# Patient Record
Sex: Female | Born: 1970 | Race: Black or African American | Hispanic: No | Marital: Single | Smoking: Current every day smoker
Health system: Southern US, Community
[De-identification: ages and names within clinical notes are randomized; demographics above are authoritative.]

## PROBLEM LIST (undated history)

## (undated) DIAGNOSIS — I2510 Atherosclerotic heart disease of native coronary artery without angina pectoris: Secondary | ICD-10-CM

## (undated) DIAGNOSIS — N289 Disorder of kidney and ureter, unspecified: Secondary | ICD-10-CM

## (undated) DIAGNOSIS — I10 Essential (primary) hypertension: Secondary | ICD-10-CM

## (undated) DIAGNOSIS — E119 Type 2 diabetes mellitus without complications: Secondary | ICD-10-CM

## (undated) DIAGNOSIS — G4700 Insomnia, unspecified: Secondary | ICD-10-CM

## (undated) HISTORY — PX: CARDIAC SURGERY: SHX584

---

## 2010-04-23 ENCOUNTER — Encounter: Payer: Self-pay | Admitting: Cardiology

## 2010-05-14 ENCOUNTER — Encounter: Payer: Self-pay | Admitting: Cardiology

## 2010-06-14 ENCOUNTER — Encounter: Payer: Self-pay | Admitting: Cardiology

## 2010-07-14 ENCOUNTER — Encounter: Payer: Self-pay | Admitting: Cardiology

## 2010-08-14 ENCOUNTER — Encounter: Payer: Self-pay | Admitting: Cardiology

## 2010-09-14 ENCOUNTER — Encounter: Payer: Self-pay | Admitting: Cardiology

## 2015-04-21 ENCOUNTER — Encounter: Payer: Self-pay | Admitting: *Deleted

## 2015-04-21 ENCOUNTER — Ambulatory Visit
Admission: EM | Admit: 2015-04-21 | Discharge: 2015-04-21 | Disposition: A | Discharge status: Home / Self Care | Payer: BLUE CROSS/BLUE SHIELD | Attending: Family Medicine | Admitting: Family Medicine

## 2015-04-21 DIAGNOSIS — F419 Anxiety disorder, unspecified: Secondary | ICD-10-CM

## 2015-04-21 DIAGNOSIS — I10 Essential (primary) hypertension: Secondary | ICD-10-CM | POA: Diagnosis not present

## 2015-04-21 HISTORY — DX: Insomnia, unspecified: G47.00

## 2015-04-21 HISTORY — DX: Disorder of kidney and ureter, unspecified: N28.9

## 2015-04-21 HISTORY — DX: Essential (primary) hypertension: I10

## 2015-04-21 HISTORY — DX: Atherosclerotic heart disease of native coronary artery without angina pectoris: I25.10

## 2015-04-21 HISTORY — DX: Type 2 diabetes mellitus without complications: E11.9

## 2015-04-21 MED ORDER — LORAZEPAM 1 MG PO TABS: 1.0000 mg | ORAL_TABLET | Freq: Three times a day (TID) | ORAL | Status: AC | PRN

## 2015-04-21 MED ORDER — CLONIDINE HCL 0.2 MG PO TABS
0.2000 mg | ORAL_TABLET | Freq: Every day | ORAL | Status: DC
  Administered 2015-04-21: 0.2 mg via ORAL

## 2015-04-21 NOTE — ED Notes (Signed)
Patient has been experiencing high blood pressure for a week. Patient does have an extensive cardiac history.

## 2015-04-21 NOTE — ED Provider Notes (Signed)
CSN: [id number]     Arrival date & time 04/21/15  [id number] History   First MD Initiated Contact with Patient 04/21/15 1013     Chief Complaint  Patient presents with  . Hypertension   (Consider location/radiation/quality/duration/timing/severity/associated sxs/prior Treatment) HPI Comments: 45 yo female with a c/o "high blood pressures". Patient has a h/o hypertension and is on medication prescribed by PCP, however states over the past week has noticed her blood pressures have been high at home >160 systolic. 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   The history is provided by the patient.    Past Medical History  Diagnosis Date  . Hypertension   . Diabetes mellitus without complication (HCC)   . Coronary artery disease   . Renal disorder   . Insomnia    Past Surgical History  Procedure Laterality Date  . Cardiac surgery     History reviewed. No pertinent family history. Social History  Substance Use Topics  . Smoking status: Current Every Day Smoker  . Smokeless tobacco: Never Used  . Alcohol Use: Yes   OB History    No data available     Review of Systems  Allergies  Review of patient's allergies indicates no known allergies.  Home Medications   Prior to Admission medications   Medication Sig Start Date End Date Taking? Authorizing Provider  aspirin 81 MG tablet Take 81 mg by mouth daily.   Yes Historical Provider, MD  hydrochlorothiazide (MICROZIDE) 12.5 MG capsule Take 12.5 mg by mouth daily.   Yes Historical Provider, MD  lisinopril (PRINIVIL,ZESTRIL) 20 MG tablet Take 20 mg by mouth daily.   Yes Historical Provider, MD  metFORMIN (GLUCOPHAGE) 1000 MG tablet Take 1,000 mg by mouth 2 (two) times daily with a meal.   Yes Historical Provider, MD  metoprolol succinate (TOPROL-XL) 25 MG 24 hr tablet Take 25 mg  by mouth daily.   Yes Historical Provider, MD  naproxen (NAPROSYN) 500 MG tablet Take 500 mg by mouth 2 (two) times daily with a meal.   Yes Historical Provider, MD  rosuvastatin (CRESTOR) 5 MG tablet Take 5 mg by mouth daily at 6 PM.   Yes Historical Provider, MD  valACYclovir (VALTREX) 1000 MG tablet Take 1,000 mg by mouth daily.   Yes Historical Provider, MD  LORazepam (ATIVAN) 1 MG tablet Take 1 tablet (1 mg total) by mouth every 8 (eight) hours as needed for anxiety. 04/21/15   Payton Mccallumrlando Bettyjane Shenoy, MD  nitroGLYCERIN (NITROSTAT) 0.4 MG SL tablet Place 0.4 mg under the tongue every 5 (five) minutes as needed for chest pain.    Historical Provider, MD   Meds Ordered and Administered this Visit  Medications - No data to display  BP 126/90 mmHg  Pulse 61  Temp(Src) 98.3 F (36.8 C) (Oral)  Resp 18  Ht 5\' 5"  (1.651 m)  Wt 197 lb (89.359 kg)  BMI 32.78 kg/m2  SpO2 100%  LMP 03/05/2015 No data found.   Physical Exam  Constitutional: She is oriented to person, place, and time. She appears well-developed and well-nourished. No distress.  HENT:  Head: Normocephalic and atraumatic.  Neck: Normal range of motion. Neck supple.  Cardiovascular: Normal rate, regular rhythm and normal heart sounds.   Pulmonary/Chest: Effort normal and breath sounds normal. No stridor. No respiratory distress. She has no wheezes. She has no rales.  Musculoskeletal: She exhibits  no edema.  Neurological: She is alert and oriented to person, place, and time. No cranial nerve deficit.  Skin: She is not diaphoretic.  Nursing note and vitals reviewed.   ED Course  Procedures (including critical care time)  Labs Review Labs Reviewed - No data to display  Imaging Review No results found.   Visual Acuity Review  Right Eye Distance:   Left Eye Distance:   Bilateral Distance:    Right Eye Near:   Left Eye Near:    Bilateral Near:         MDM   1. Essential hypertension   2. Anxiety    Discharge  Medication List as of 04/21/2015 12:08 PM    START taking these medications   Details  LORazepam (ATIVAN) 1 MG tablet Take 1 tablet (1 mg total) by mouth every 8 (eight) hours as needed for anxiety., Starting 04/21/2015, Until Discontinued, Print       1.  diagnosis reviewed with patient; patient given clonidine 0.2mg  po x 1 with improvement of blood pressure 2. rx as per orders above; reviewed possible side effects, interactions, risks and benefits  3. Recommend supportive treatment with anxiety/stress management 4. Follow-up with PCP 5. F/U here prn if symptoms worsen or don't improve    Payton Mccallum, MD 04/21/15 1525

## 2017-10-06 ENCOUNTER — Emergency Department: Payer: BLUE CROSS/BLUE SHIELD

## 2017-10-06 ENCOUNTER — Emergency Department
Admission: EM | Admit: 2017-10-06 | Discharge: 2017-10-06 | Disposition: A | Discharge status: Home / Self Care | Payer: BLUE CROSS/BLUE SHIELD | Attending: Emergency Medicine | Admitting: Emergency Medicine

## 2017-10-06 ENCOUNTER — Other Ambulatory Visit: Payer: Self-pay

## 2017-10-06 DIAGNOSIS — E119 Type 2 diabetes mellitus without complications: Secondary | ICD-10-CM | POA: Insufficient documentation

## 2017-10-06 DIAGNOSIS — Z794 Long term (current) use of insulin: Secondary | ICD-10-CM | POA: Insufficient documentation

## 2017-10-06 DIAGNOSIS — I10 Essential (primary) hypertension: Secondary | ICD-10-CM | POA: Diagnosis not present

## 2017-10-06 DIAGNOSIS — R202 Paresthesia of skin: Secondary | ICD-10-CM

## 2017-10-06 DIAGNOSIS — Z79899 Other long term (current) drug therapy: Secondary | ICD-10-CM | POA: Insufficient documentation

## 2017-10-06 DIAGNOSIS — I259 Chronic ischemic heart disease, unspecified: Secondary | ICD-10-CM | POA: Insufficient documentation

## 2017-10-06 DIAGNOSIS — Z7982 Long term (current) use of aspirin: Secondary | ICD-10-CM | POA: Insufficient documentation

## 2017-10-06 DIAGNOSIS — F17200 Nicotine dependence, unspecified, uncomplicated: Secondary | ICD-10-CM | POA: Diagnosis not present

## 2017-10-06 DIAGNOSIS — R531 Weakness: Secondary | ICD-10-CM | POA: Diagnosis present

## 2017-10-06 LAB — BASIC METABOLIC PANEL
ANION GAP: 12 (ref 5–15)
BUN: 11 mg/dL (ref 6–20)
CHLORIDE: 100 mmol/L (ref 98–111)
CO2: 23 mmol/L (ref 22–32)
Calcium: 9.6 mg/dL (ref 8.9–10.3)
Creatinine, Ser: 0.95 mg/dL (ref 0.44–1.00)
GFR calc Af Amer: >60 mL/min (ref >60)
GFR calc non Af Amer: >60 mL/min (ref >60)
GLUCOSE: 182 mg/dL — AB (ref 70–99)
POTASSIUM: 3.8 mmol/L (ref 3.5–5.1)
Sodium: 135 mmol/L (ref 135–145)

## 2017-10-06 LAB — CBC WITH DIFFERENTIAL/PLATELET
BASOS PCT: 1 %
Basophils Absolute: 0.1 10*3/uL (ref 0–0.1)
Eosinophils Absolute: 0.1 10*3/uL (ref 0–0.7)
Eosinophils Relative: 1 %
HEMATOCRIT: 41.3 % (ref 35.0–47.0)
Hemoglobin: 14.1 g/dL (ref 12.0–16.0)
LYMPHS ABS: 1.6 10*3/uL (ref 1.0–3.6)
LYMPHS PCT: 29 %
MCH: 32 pg (ref 26.0–34.0)
MCHC: 34.2 g/dL (ref 32.0–36.0)
MCV: 93.5 fL (ref 80.0–100.0)
MONOS PCT: 7 %
Monocytes Absolute: 0.4 10*3/uL (ref 0.2–0.9)
NEUTROS PCT: 62 %
Neutro Abs: 3.4 10*3/uL (ref 1.4–6.5)
Platelets: 254 10*3/uL (ref 150–440)
RBC: 4.42 MIL/uL (ref 3.80–5.20)
RDW: 14.7 % — ABNORMAL HIGH (ref 11.5–14.5)
WBC: 5.5 10*3/uL (ref 3.6–11.0)

## 2017-10-06 LAB — GLUCOSE, CAPILLARY: GLUCOSE-CAPILLARY: 181 mg/dL — AB (ref 70–99)

## 2017-10-06 LAB — URINALYSIS, COMPLETE (UACMP) WITH MICROSCOPIC
BACTERIA UA: NONE SEEN
BILIRUBIN URINE: NEGATIVE
Glucose, UA: NEGATIVE mg/dL
Hgb urine dipstick: NEGATIVE
KETONES UR: NEGATIVE mg/dL
Leukocytes, UA: NEGATIVE
NITRITE: NEGATIVE
Protein, ur: NEGATIVE mg/dL
SPECIFIC GRAVITY, URINE: 1.002 — AB (ref 1.005–1.030)
pH: 6 (ref 5.0–8.0)

## 2017-10-06 LAB — TROPONIN I
Troponin I: <0.03 ng/mL (ref <0.03)
Troponin I: <0.03 ng/mL (ref <0.03)

## 2017-10-06 LAB — POCT PREGNANCY, URINE: PREG TEST UR: NEGATIVE

## 2017-10-06 NOTE — Discharge Instructions (Addendum)
Your labs, CT scan, and MRI today all were unremarkable. Results are copied below. Please follow up with your primary care doctor for further evaluation of your symptoms.  Results for orders placed or performed during the hospital encounter of 10/06/17  Basic metabolic panel  Result Value Ref Range   Sodium 135 135 - 145 mmol/L   Potassium 3.8 3.5 - 5.1 mmol/L   Chloride 100 98 - 111 mmol/L   CO2 23 22 - 32 mmol/L   Glucose, Bld 182 (H) 70 - 99 mg/dL   BUN 11 6 - 20 mg/dL   Creatinine, Ser 1.610.95 0.44 - 1.00 mg/dL   Calcium 9.6 8.9 - 09.610.3 mg/dL   GFR calc non Af Amer >60 >60 mL/min   GFR calc Af Amer >60 >60 mL/min   Anion gap 12 5 - 15  Troponin I  Result Value Ref Range   Troponin I <0.03 <0.03 ng/mL  CBC with Differential  Result Value Ref Range   WBC 5.5 3.6 - 11.0 K/uL   RBC 4.42 3.80 - 5.20 MIL/uL   Hemoglobin 14.1 12.0 - 16.0 g/dL   HCT 04.541.3 40.935.0 - 81.147.0 %   MCV 93.5 80.0 - 100.0 fL   MCH 32.0 26.0 - 34.0 pg   MCHC 34.2 32.0 - 36.0 g/dL   RDW 91.414.7 (H) 78.211.5 - 95.614.5 %   Platelets 254 150 - 440 K/uL   Neutrophils Relative % 62 %   Neutro Abs 3.4 1.4 - 6.5 K/uL   Lymphocytes Relative 29 %   Lymphs Abs 1.6 1.0 - 3.6 K/uL   Monocytes Relative 7 %   Monocytes Absolute 0.4 0.2 - 0.9 K/uL   Eosinophils Relative 1 %   Eosinophils Absolute 0.1 0 - 0.7 K/uL   Basophils Relative 1 %   Basophils Absolute 0.1 0 - 0.1 K/uL  Urinalysis, Complete w Microscopic  Result Value Ref Range   Color, Urine COLORLESS (A) YELLOW   APPearance CLEAR (A) CLEAR   Specific Gravity, Urine 1.002 (L) 1.005 - 1.030   pH 6.0 5.0 - 8.0   Glucose, UA NEGATIVE NEGATIVE mg/dL   Hgb urine dipstick NEGATIVE NEGATIVE   Bilirubin Urine NEGATIVE NEGATIVE   Ketones, ur NEGATIVE NEGATIVE mg/dL   Protein, ur NEGATIVE NEGATIVE mg/dL   Nitrite NEGATIVE NEGATIVE   Leukocytes, UA NEGATIVE NEGATIVE   RBC / HPF 0-5 0 - 5 RBC/hpf   WBC, UA 0-5 0 - 5 WBC/hpf   Bacteria, UA NONE SEEN NONE SEEN   Squamous Epithelial  / LPF 0-5 0 - 5  Glucose, capillary  Result Value Ref Range   Glucose-Capillary 181 (H) 70 - 99 mg/dL  Troponin I  Result Value Ref Range   Troponin I <0.03 <0.03 ng/mL  Pregnancy, urine POC  Result Value Ref Range   Preg Test, Ur NEGATIVE NEGATIVE   Ct Head Wo Contrast  Result Date: 10/06/2017 CLINICAL DATA:  Left-sided tingling and weakness. Intermittent headaches EXAM: CT HEAD WITHOUT CONTRAST TECHNIQUE: Contiguous axial images were obtained from the base of the skull through the vertex without intravenous contrast. COMPARISON:  None. FINDINGS: Brain: Ventricles overall are mildly prominent diffusely for age. Sulci appear normal. No intracranial mass, hemorrhage, extra-axial fluid collection, or midline shift. No focal gray-white compartment lesions are evident. No acute infarct. Vascular: No hyperdense vessel. There is no appreciable vascular calcification. Skull: Bony calvarium appears intact. Sinuses/Orbits: Visualized paranasal sinuses are clear. Visualized orbits appear symmetric bilaterally. Other: Mastoid air cells are clear. IMPRESSION: Ventricles  prominent for age in a generalized manner. Sulci appear normal. No evident mass or hemorrhage. Gray-white compartments appear normal. Electronically Signed   By: Bretta Bang III M.D.   On: 10/06/2017 14:11   Mr Brain Wo Contrast  Result Date: 10/06/2017 CLINICAL DATA:  LEFT extremity numbness beginning at 11 a.m. History of hypertension and diabetes. EXAM: MRI HEAD WITHOUT CONTRAST TECHNIQUE: Multiplanar, multiecho pulse sequences of the brain and surrounding structures were obtained without intravenous contrast. COMPARISON:  CT HEAD October 06, 2017 FINDINGS: INTRACRANIAL CONTENTS: No reduced diffusion to suggest acute ischemia or hyperacute demyelination. No susceptibility artifact to suggest hemorrhage. Moderate global parenchymal brain volume loss. Minimal periventricular nonspecific white matter FLAIR T2 hyperintensities. No  hydrocephalus. No suspicious parenchymal signal, masses, mass effect. No abnormal extra-axial fluid collections. No extra-axial masses. VASCULAR: Normal major intracranial vascular flow voids present at skull base. SKULL AND UPPER CERVICAL SPINE: No abnormal sellar expansion. No suspicious calvarial bone marrow signal. Craniocervical junction maintained. SINUSES/ORBITS: The mastoid air-cells and included paranasal sinuses are well-aerated.The included ocular globes and orbital contents are non-suspicious. OTHER: None. IMPRESSION: 1. No acute intracranial process. 2. Moderate global parenchymal brain volume loss. Electronically Signed   By: Awilda Metro M.D.   On: 10/06/2017 17:32

## 2017-10-06 NOTE — ED Triage Notes (Signed)
Per EMS pt was at work and noticed about 11am that she was experiencing left arm, hand, and leg numbness.  Pt denies symptoms on arrival to ED.  VS for EMS:  197/106, 90 pulse.  Blood glucose 135.  Hx of HTN;  Takes Metoprolol and Lisinopril daily.

## 2017-10-06 NOTE — ED Provider Notes (Signed)
[facility] Emergency Department Provider Note  ____________________________________________  Time seen: Approximately 1:26 PM  I have reviewed the triage vital signs and the nursing notes.   HISTORY  Chief Complaint Weakness    HPI Julie Petersen is a 47 y.o. female who was in her usual state of health until about 10:30 AM today when she had paresthesia of the left forearm left hand and left lower leg.  Denies any motor weakness vision changes or headaches.  No dizziness or syncope.  No recent trauma.  No chest pain or shortness of breath or exertional symptoms.  Discontinued for an hour and a half and then resolved.  She is currently back to normal and asymptomatic but she has a strong family history of strokes.  She also has a history of diabetes hypertension and smoking, so she comes to the ED for evaluation.   The patient took 325 mg of aspirin prior to EMS arrival on scene today.   Past Medical History:  Diagnosis Date  . Coronary artery disease   . Diabetes mellitus without complication (HCC)   . Hypertension   . Insomnia   . Renal disorder      There are no active problems to display for this patient.    Past Surgical History:  Procedure Laterality Date  . CARDIAC SURGERY    Coronary artery stent   Prior to Admission medications   Medication Sig Start Date End Date Taking? Authorizing Provider  amLODipine (NORVASC) 5 MG tablet Take 1 tablet by mouth daily. 10/05/17  Yes [provider]  aspirin 81 MG tablet Take 81 mg by mouth daily.   Yes [provider]  lisinopril (PRINIVIL,ZESTRIL) 20 MG tablet Take 20 mg by mouth daily.   Yes [provider]  metFORMIN (GLUCOPHAGE) 1000 MG tablet Take 1,000 mg by mouth 2 (two) times daily with a meal.   Yes [provider]  metoprolol succinate (TOPROL-XL) 25 MG 24 hr tablet Take 25 mg by mouth daily.   Yes [provider]  rosuvastatin (CRESTOR) 5 MG  tablet Take 5 mg by mouth daily at 6 PM.   Yes [provider]  Semaglutide,0.25 or 0.5MG /DOS, 2 MG/1.5ML SOPN Inject 0.25 mg into the skin once a week. 10/02/17  Yes [provider]  TRESIBA FLEXTOUCH 100 UNIT/ML SOPN FlexTouch Pen Inject 20 Units into the skin daily. 10/02/17  Yes [provider]  valACYclovir (VALTREX) 1000 MG tablet Take 1,000 mg by mouth daily.   Yes [provider]  LORazepam (ATIVAN) 1 MG tablet Take 1 tablet (1 mg total) by mouth every 8 (eight) hours as needed for anxiety. Patient not taking: Reported on 10/06/2017 04/21/15   Payton Mccallum, MD  naproxen (NAPROSYN) 500 MG tablet Take 500 mg by mouth 2 (two) times daily with a meal.    [provider]  nitroGLYCERIN (NITROSTAT) 0.4 MG SL tablet Place 0.4 mg under the tongue every 5 (five) minutes as needed for chest pain.    [provider]     Allergies Patient has no known allergies.   No family history on file.  Social History Social History   Tobacco Use  . Smoking status: Current Every Day Smoker  . Smokeless tobacco: Never Used  Substance Use Topics  . Alcohol use: Yes  . Drug use: No    Review of Systems  Constitutional:   No fever or chills.  ENT:   No sore throat. No rhinorrhea. Cardiovascular:   No  chest pain or syncope. Respiratory:   No dyspnea or cough. Gastrointestinal:   Negative for abdominal pain, vomiting and diarrhea.  Musculoskeletal:   Negative for focal pain or swelling Neurologic: Left arm and left leg paresthesia as above All other systems reviewed and are negative except as documented above in ROS and HPI.  ____________________________________________   PHYSICAL EXAM:  VITAL SIGNS: ED Triage Vitals  Enc Vitals Group     BP 10/06/17 1324 (!) 154/107     Pulse Rate 10/06/17 1324 82     Resp --      Temp 10/06/17 1324 98.7 F (37.1 C)     Temp Source 10/06/17 1324 Oral     SpO2 10/06/17 1324 100 %     Weight 10/06/17  1325 210 lb (95.3 kg)     Height 10/06/17 1325 5\' 5"  (1.651 m)     Head Circumference --      Peak Flow --      Pain Score 10/06/17 1325 0     Pain Loc --      Pain Edu? --      Excl. in GC? --     Vital signs reviewed, nursing assessments reviewed.   Constitutional:   Alert and oriented. Non-toxic appearance. Eyes:   Conjunctivae are normal. EOMI. PERRL. ENT      Head:   Normocephalic and atraumatic.      Nose:   No congestion/rhinnorhea.       Mouth/Throat:   MMM, no pharyngeal erythema. No peritonsillar mass.       Neck:   No meningismus. Full ROM. Hematological/Lymphatic/Immunilogical:   No cervical lymphadenopathy. Cardiovascular:   RRR. Symmetric bilateral radial and DP pulses.  No murmurs. Cap refill less than 2 seconds. Respiratory:   Normal respiratory effort without tachypnea/retractions. Breath sounds are clear and equal bilaterally. No wheezes/rales/rhonchi. Gastrointestinal:   Soft and nontender. Non distended. There is no CVA tenderness.  No rebound, rigidity, or guarding. Musculoskeletal:   Normal range of motion in all extremities. No joint effusions.  No lower extremity tenderness.  No edema. Neurologic:   Normal speech and language. Cranial nerves III through XII intact No pronator drift.  Normal cerebellar function Motor grossly intact. No acute focal neurologic deficits are appreciated.  Skin:    Skin is warm, dry and intact. No rash noted.  No petechiae, purpura, or bullae.  ____________________________________________    LABS (pertinent positives/negatives) (all labs ordered are listed, but only abnormal results are displayed) Labs Reviewed  BASIC METABOLIC PANEL - Abnormal; Notable for the following components:      Result Value   Glucose, Bld 182 (*)    All other components within normal limits  CBC WITH DIFFERENTIAL/PLATELET - Abnormal; Notable for the following components:   RDW 14.7 (*)    All other components within normal limits  URINALYSIS,  COMPLETE (UACMP) WITH MICROSCOPIC - Abnormal; Notable for the following components:   Color, Urine COLORLESS (*)    APPearance CLEAR (*)    Specific Gravity, Urine 1.002 (*)    All other components within normal limits  GLUCOSE, CAPILLARY - Abnormal; Notable for the following components:   Glucose-Capillary 181 (*)    All other components within normal limits  TROPONIN I  TROPONIN I  POC URINE PREG, ED  POCT PREGNANCY, URINE   ____________________________________________   EKG  Interpreted by me Sinus rhythm rate of 65, normal axis intervals QRS and ST segments.  Inferior T wave inversions, nonacute in appearance and  likely related to chronic CAD.  No prior comparison available..  ____________________________________________    RADIOLOGY  Ct Head Wo Contrast  Result Date: 10/06/2017 CLINICAL DATA:  Left-sided tingling and weakness. Intermittent headaches EXAM: CT HEAD WITHOUT CONTRAST TECHNIQUE: Contiguous axial images were obtained from the base of the skull through the vertex without intravenous contrast. COMPARISON:  None. FINDINGS: Brain: Ventricles overall are mildly prominent diffusely for age. Sulci appear normal. No intracranial mass, hemorrhage, extra-axial fluid collection, or midline shift. No focal gray-white compartment lesions are evident. No acute infarct. Vascular: No hyperdense vessel. There is no appreciable vascular calcification. Skull: Bony calvarium appears intact. Sinuses/Orbits: Visualized paranasal sinuses are clear. Visualized orbits appear symmetric bilaterally. Other: Mastoid air cells are clear. IMPRESSION: Ventricles prominent for age in a generalized manner. Sulci appear normal. No evident mass or hemorrhage. Gray-white compartments appear normal. Electronically Signed   By: Bretta BangWilliam  Woodruff III M.D.   On: 10/06/2017 14:11   Mr Brain Wo Contrast  Result Date: 10/06/2017 CLINICAL DATA:  LEFT extremity numbness beginning at 11 a.m. History of hypertension  and diabetes. EXAM: MRI HEAD WITHOUT CONTRAST TECHNIQUE: Multiplanar, multiecho pulse sequences of the brain and surrounding structures were obtained without intravenous contrast. COMPARISON:  CT HEAD October 06, 2017 FINDINGS: INTRACRANIAL CONTENTS: No reduced diffusion to suggest acute ischemia or hyperacute demyelination. No susceptibility artifact to suggest hemorrhage. Moderate global parenchymal brain volume loss. Minimal periventricular nonspecific white matter FLAIR T2 hyperintensities. No hydrocephalus. No suspicious parenchymal signal, masses, mass effect. No abnormal extra-axial fluid collections. No extra-axial masses. VASCULAR: Normal major intracranial vascular flow voids present at skull base. SKULL AND UPPER CERVICAL SPINE: No abnormal sellar expansion. No suspicious calvarial bone marrow signal. Craniocervical junction maintained. SINUSES/ORBITS: The mastoid air-cells and included paranasal sinuses are well-aerated.The included ocular globes and orbital contents are non-suspicious. OTHER: None. IMPRESSION: 1. No acute intracranial process. 2. Moderate global parenchymal brain volume loss. Electronically Signed   By: Awilda Metroourtnay  Bloomer M.D.   On: 10/06/2017 17:32    ____________________________________________   PROCEDURES Procedures  ____________________________________________  DIFFERENTIAL DIAGNOSIS   TIA, ischemic stroke, non-STEMI, nonspecific paresthesia, diabetic peripheral neuropathy  CLINICAL IMPRESSION / ASSESSMENT AND PLAN / ED COURSE  Pertinent labs & imaging results that were available during my care of the patient were reviewed by me and considered in my medical decision making (see chart for details).      Clinical Course as of Oct 07 1934  Tue Oct 06, 2017  1324 Seen on arrival.  Had possible stroke symptoms that started at 1030, lasted 1.5 hours, then resolved.  Will pursue initial stroke work-up with labs, CT head, MRI brain (if the CT is negative).  If  work-up is reassuring, I think patient can be discharged home to continue outpatient stroke work-up given that her symptoms were brief and completely resolved at this time.   [PS]  1429 Initial work-up negative with labs troponin and CT scan of the head all unremarkable.  Will proceed with MRI brain.  Will obtain delta troponin in the process.   [PS]  1655 Repeat troponin negative  Troponin I: <0.03 [PS]  1821 Mri brain NAD. Stable for DC home. Continue aspirin pending PCP follow up and further eval.   [PS]    Clinical Course User Index [PS] Sharman CheekStafford, Melynda Krzywicki, MD     ____________________________________________   FINAL CLINICAL IMPRESSION(S) / ED DIAGNOSES    Final diagnoses:  Paresthesia     ED Discharge Orders    None  Portions of this note were generated with dragon dictation software. Dictation errors may occur despite best attempts at proofreading.    Sharman Cheek, MD 10/06/17 [phone]

## 2017-10-06 NOTE — ED Notes (Signed)
Pt to MRI

## 2018-11-22 ENCOUNTER — Emergency Department
Admission: EM | Admit: 2018-11-22 | Discharge: 2018-11-22 | Disposition: A | Discharge status: Home / Self Care | Payer: BC Managed Care – PPO | Attending: Emergency Medicine | Admitting: Emergency Medicine

## 2018-11-22 ENCOUNTER — Encounter: Payer: Self-pay | Admitting: Emergency Medicine

## 2018-11-22 DIAGNOSIS — R220 Localized swelling, mass and lump, head: Secondary | ICD-10-CM | POA: Diagnosis present

## 2018-11-22 DIAGNOSIS — I10 Essential (primary) hypertension: Secondary | ICD-10-CM | POA: Diagnosis not present

## 2018-11-22 DIAGNOSIS — T464X5A Adverse effect of angiotensin-converting-enzyme inhibitors, initial encounter: Secondary | ICD-10-CM | POA: Insufficient documentation

## 2018-11-22 DIAGNOSIS — T783XXA Angioneurotic edema, initial encounter: Secondary | ICD-10-CM | POA: Diagnosis not present

## 2018-11-22 DIAGNOSIS — Z7984 Long term (current) use of oral hypoglycemic drugs: Secondary | ICD-10-CM | POA: Diagnosis not present

## 2018-11-22 DIAGNOSIS — Z79899 Other long term (current) drug therapy: Secondary | ICD-10-CM | POA: Insufficient documentation

## 2018-11-22 DIAGNOSIS — T887XXA Unspecified adverse effect of drug or medicament, initial encounter: Secondary | ICD-10-CM | POA: Insufficient documentation

## 2018-11-22 DIAGNOSIS — I2510 Atherosclerotic heart disease of native coronary artery without angina pectoris: Secondary | ICD-10-CM | POA: Insufficient documentation

## 2018-11-22 DIAGNOSIS — Y658 Other specified misadventures during surgical and medical care: Secondary | ICD-10-CM | POA: Insufficient documentation

## 2018-11-22 DIAGNOSIS — E119 Type 2 diabetes mellitus without complications: Secondary | ICD-10-CM | POA: Diagnosis not present

## 2018-11-22 DIAGNOSIS — F17200 Nicotine dependence, unspecified, uncomplicated: Secondary | ICD-10-CM | POA: Insufficient documentation

## 2018-11-22 DIAGNOSIS — Z7982 Long term (current) use of aspirin: Secondary | ICD-10-CM | POA: Diagnosis not present

## 2018-11-22 MED ORDER — FAMOTIDINE IN NACL 20-0.9 MG/50ML-% IV SOLN
20.0000 mg | Freq: Once | INTRAVENOUS | Status: AC
  Administered 2018-11-22: 20 mg via INTRAVENOUS
  Filled 2018-11-22: qty 50

## 2018-11-22 MED ORDER — EPINEPHRINE 0.3 MG/0.3ML IJ SOAJ
0.3000 mg | Freq: Once | INTRAMUSCULAR | Status: AC
  Administered 2018-11-22: 0.3 mg via INTRAMUSCULAR
  Filled 2018-11-22: qty 0.3

## 2018-11-22 MED ORDER — TRANEXAMIC ACID-NACL 1000-0.7 MG/100ML-% IV SOLN
1000.0000 mg | INTRAVENOUS | Status: AC
  Administered 2018-11-22: 1000 mg via INTRAVENOUS
  Filled 2018-11-22: qty 100

## 2018-11-22 MED ORDER — DIPHENHYDRAMINE HCL 50 MG/ML IJ SOLN
25.0000 mg | Freq: Once | INTRAMUSCULAR | Status: AC
  Administered 2018-11-22: 25 mg via INTRAVENOUS
  Filled 2018-11-22: qty 1

## 2018-11-22 MED ORDER — DEXAMETHASONE SODIUM PHOSPHATE 10 MG/ML IJ SOLN
10.0000 mg | Freq: Once | INTRAMUSCULAR | Status: AC
  Administered 2018-11-22: 10 mg via INTRAVENOUS
  Filled 2018-11-22: qty 1

## 2018-11-22 MED ORDER — HYDROCHLOROTHIAZIDE 12.5 MG PO TABS: 12.5000 mg | ORAL_TABLET | Freq: Every day | ORAL | 0 refills | Status: DC

## 2018-11-22 MED ORDER — METOPROLOL SUCCINATE ER 25 MG PO TB24: 50.0000 mg | ORAL_TABLET | Freq: Every day | ORAL | 0 refills | Status: AC

## 2018-11-22 NOTE — ED Notes (Signed)
Patient resting quietly at this time, voices no complaints or needs.

## 2018-11-22 NOTE — Discharge Instructions (Addendum)
Please discontinue your lisinopril and discard all the remaining pills that you have at home. You should increase your metoprolol to 50 mg daily.  Follow-up with your cardiologist.  Keep a blood pressure diary. Please return to the emergency room immediately if the swelling recurs. Otherwise follow-up with your primary care doctor.

## 2018-11-22 NOTE — ED Triage Notes (Signed)
Pt reports she noticed swelling under tongue as well as tongue and left side of neck tonight. Pt denies SOB. MD at bedside. Pt takes Lisinopril.

## 2018-11-22 NOTE — ED Provider Notes (Signed)
[facility] Emergency Department Provider Note  ____________________________________________  Time seen: Approximately 12:21 AM  I have reviewed the triage vital signs and the nursing notes.   HISTORY  Chief Complaint Allergic Reaction   HPI Julie Petersen is a 48 y.o. female with history of hypertension on lisinopril who presents for evaluation of tongue swelling.  Patient reports that her symptoms started this evening, suddenly and woke her up from her sleep.  She was concerned she had bitten her tongue but she could not see any trauma.  The swelling got progressively worse and she was having a hard time speaking which prompted her to go to urgent care.  Patient took some Benadryl before going there.  She reports that the swelling is improving and she is now able to speak better.  She denies any new medications or any history of allergic reaction, denies any new foods.  She denies hives, difficulty breathing, difficulty swallowing, throat closing sensation.  She reports that the swelling is currently moderate involving her tongue worse on the left side and the subglottic tissue.   Past Medical History:  Diagnosis Date  . Coronary artery disease   . Diabetes mellitus without complication (HCC)   . Hypertension   . Insomnia   . Renal disorder      Past Surgical History:  Procedure Laterality Date  . CARDIAC SURGERY      Prior to Admission medications   Medication Sig Start Date End Date Taking? Authorizing Provider  amLODipine (NORVASC) 5 MG tablet Take 1 tablet by mouth daily. 10/05/17   [provider]  aspirin 81 MG tablet Take 81 mg by mouth daily.    [provider]  LORazepam (ATIVAN) 1 MG tablet Take 1 tablet (1 mg total) by mouth every 8 (eight) hours as needed for anxiety. Patient not taking: Reported on 10/06/2017 04/21/15   Payton Mccallumonty, [place], MD  metFORMIN (GLUCOPHAGE) 1000 MG tablet Take 1,000 mg by mouth 2 (two) times daily  with a meal.    [provider]  metoprolol succinate (TOPROL-XL) 25 MG 24 hr tablet Take 2 tablets (50 mg total) by mouth daily. 11/22/18   Nita SickleVeronese, [place], MD  naproxen (NAPROSYN) 500 MG tablet Take 500 mg by mouth 2 (two) times daily with a meal.    [provider]  nitroGLYCERIN (NITROSTAT) 0.4 MG SL tablet Place 0.4 mg under the tongue every 5 (five) minutes as needed for chest pain.    [provider]  rosuvastatin (CRESTOR) 5 MG tablet Take 5 mg by mouth daily at 6 PM.    [provider]  Semaglutide,0.25 or 0.5MG /DOS, 2 MG/1.5ML SOPN Inject 0.25 mg into the skin once a week. 10/02/17   [provider]  TRESIBA FLEXTOUCH 100 UNIT/ML SOPN FlexTouch Pen Inject 20 Units into the skin daily. 10/02/17   [provider]  valACYclovir (VALTREX) 1000 MG tablet Take 1,000 mg by mouth daily.    [provider]  hydrochlorothiazide (HYDRODIURIL) 12.5 MG tablet Take 1 tablet (12.5 mg total) by mouth daily. 11/22/18 11/22/18  Nita SickleVeronese, [place], MD    Allergies Lisinopril  History reviewed. No pertinent family history.  Social History Social History   Tobacco Use  . Smoking status: Current Every Day Smoker  . Smokeless tobacco: Never Used  Substance Use Topics  . Alcohol use: Yes  . Drug use: No    Review of Systems  Constitutional: Negative for fever. Eyes: Negative for visual changes. ENT: Negative for sore throat. +  angioedema Neck: No neck pain  Cardiovascular: Negative for chest pain. Respiratory: Negative for shortness of breath. Gastrointestinal: Negative for abdominal pain, vomiting or diarrhea. Genitourinary: Negative for dysuria. Musculoskeletal: Negative for back pain. Skin: Negative for rash. Neurological: Negative for headaches, weakness or numbness. Psych: No SI or HI  ____________________________________________   PHYSICAL EXAM:  VITAL SIGNS: ED Triage Vitals [11/22/18 0018]  Enc Vitals Group      BP 138/89     Pulse Rate 98     Resp 20     Temp 98.4 F (36.9 C)     Temp Source Oral     SpO2 99 %     Weight      Height      Head Circumference      Peak Flow      Pain Score      Pain Loc      Pain Edu?      Excl. in [place]?     Constitutional: Alert and oriented. Well appearing and in no apparent distress. HEENT:      Head: Normocephalic and atraumatic.         Eyes: Conjunctivae are normal. Sclera is non-icteric.       Mouth/Throat: Mucous membranes are moist. Asymmetric swelling of the tongue and subglotic region L worse then right, muffled voice, no stridor, uvula is midline, lips are normal, no erythema, induration, or tenderness of the floor of the mouth, no trismus, airways patent      Neck: Supple with no signs of meningismus. Cardiovascular: Regular rate and rhythm. No murmurs, gallops, or rubs. 2+ symmetrical distal pulses are present in all extremities. No JVD. Respiratory: Normal respiratory effort. Lungs are clear to auscultation bilaterally. No wheezes, crackles, or rhonchi.  Musculoskeletal: Nontender with normal range of motion in all extremities. No edema, cyanosis, or erythema of extremities. Neurologic: Normal speech and language. Face is symmetric. Moving all extremities. No gross focal neurologic deficits are appreciated. Skin: Skin is warm, dry and intact. No rash noted. Psychiatric: Mood and affect are normal. Speech and behavior are normal.  ____________________________________________   LABS (all labs ordered are listed, but only abnormal results are displayed)  Labs Reviewed - No data to display ____________________________________________  EKG  none  ____________________________________________  RADIOLOGY  none  ____________________________________________   PROCEDURES  Procedure(s) performed: None Procedures Critical Care performed:  None ____________________________________________   INITIAL IMPRESSION / ASSESSMENT AND PLAN / ED  COURSE   48 y.o. female with history of hypertension on lisinopril who presents for evaluation of tongue swelling.  Differential diagnosis including angioedema from lisinopril versus allergic reaction although thought to be less likely.  Symptoms seem to be improving per patient's exam and history.  Will give EpiPen, Benadryl, Decadron, Pepcid, and IV TXA.  Will monitor airway closely.  Clinical Course as of Nov 21 709  [facility] Nov 22, 2018  0112 Reassessment: swelling improving. Given all meds. TXA currently being admisnistered   [CV]  0403 Reassessment: swelling improved but still not back to normal. Will continue to monitor   [CV]  0637 Reassessment: Swelling has improved significantly. Voice is back to normal. Subglotic swelling is mostly resolved. I am now able to see the uvula and oropharynx. There is still small amount of swelling of the tongue. We discussed admission for further monitoring vs close PCP follow up with strict return precautions. Patient requested to go home. She feels markedly improved, no difficulty breathing, speaking or swallowing. She needs to work today and  has promised to return via ambulance if the swelling returns. I told her to stop lisinopril and to get rid of the left over she has at home. She is on metoprolol and amlodipine 10mg . Her BP has been very well controlled.  Patient has been on hydrochlorothiazide in the past and did not tolerate well.  At this time will increase her metoprolol to 50 mg daily since her heart rate still in the 90s and have her follow-up with her cardiologist.  I recommended her keeping a BP diary for now.    [CV]    Clinical Course User Index [CV] Alfred Levins [place], MD      As part of my medical decision making, I reviewed the following data within the [facility] notes reviewed and incorporated, Old chart reviewed, Notes from prior ED visits and [place] Controlled Substance Database   Patient was evaluated in  Emergency Department today for the symptoms described in the history of present illness. Patient was evaluated in the context of the global COVID-19 pandemic, which necessitated consideration that the patient might be at risk for infection with the SARS-CoV-2 virus that causes COVID-19. Institutional protocols and algorithms that pertain to the evaluation of patients at risk for COVID-19 are in a state of rapid change based on information released by regulatory bodies including the CDC and federal and state organizations. These policies and algorithms were followed during the patient's care in the ED.   ____________________________________________   FINAL CLINICAL IMPRESSION(S) / ED DIAGNOSES   Final diagnoses:  Angioedema due to angiotensin converting enzyme inhibitor (ACE-I)      NEW MEDICATIONS STARTED DURING THIS VISIT:  ED Discharge Orders         Ordered    hydrochlorothiazide (HYDRODIURIL) 12.5 MG tablet  Daily,   Status:  Discontinued     11/22/18 0645    metoprolol succinate (TOPROL-XL) 25 MG 24 hr tablet  Daily     11/22/18 0648           Note:  This document was prepared using Dragon voice recognition software and may include unintentional dictation errors.    Rudene Re, MD 11/22/18 [phone]

## 2018-11-22 NOTE — ED Notes (Signed)
Continues to rest quietly with no complaints.

## 2018-11-22 NOTE — ED Notes (Signed)
Assumed care of patient.  Patient resting quietly, voices no complaints at this time.

## 2019-02-25 ENCOUNTER — Ambulatory Visit
Admission: EM | Admit: 2019-02-25 | Discharge: 2019-02-25 | Disposition: A | Discharge status: Home / Self Care | Payer: BC Managed Care – PPO | Attending: Emergency Medicine | Admitting: Emergency Medicine

## 2019-02-25 ENCOUNTER — Encounter: Payer: Self-pay | Admitting: Emergency Medicine

## 2019-02-25 ENCOUNTER — Other Ambulatory Visit: Payer: Self-pay

## 2019-02-25 DIAGNOSIS — L0291 Cutaneous abscess, unspecified: Secondary | ICD-10-CM | POA: Insufficient documentation

## 2019-02-25 MED ORDER — MUPIROCIN 2 % EX OINT: 1.0000 "application " | TOPICAL_OINTMENT | Freq: Three times a day (TID) | CUTANEOUS | 0 refills | Status: AC

## 2019-02-25 MED ORDER — DOXYCYCLINE HYCLATE 100 MG PO CAPS: 100.0000 mg | ORAL_CAPSULE | Freq: Two times a day (BID) | ORAL | 0 refills | Status: AC

## 2019-02-25 NOTE — ED Provider Notes (Signed)
[facility]    CSN: [id number] Arrival date & time: 02/25/19  1046      History   Chief Complaint Chief Complaint  Patient presents with  . Abscess    HPI Alaylah Heatherington is a 49 y.o. female.   HPI  49 year old female presents with an abscess that she has on her panty line in the left groin.  Is been present for about 9 months and is running a waxing and waning course.  Reported some draining about 3 months ago.  She states that recently has been draining somewhat but continues to be very hard knot that is very painful.  She does shave the area especially since she is unable to having wax treatments during the Covid infection.  She has been using only tea tree oil on it for treatment.  She has never had the area medically evaluated.  Has had no fever or chills.  She does have a history of diabetes type 2 that has been poorly managed.  She is allergic to lisinopril and Lipitor.        Past Medical History:  Diagnosis Date  . Coronary artery disease   . Diabetes mellitus without complication (HCC)   . Hypertension   . Insomnia   . Renal disorder     There are no problems to display for this patient.   Past Surgical History:  Procedure Laterality Date  . CARDIAC SURGERY      OB History   No obstetric history on file.      Home Medications    Prior to Admission medications   Medication Sig Start Date End Date Taking? Authorizing Provider  amLODipine (NORVASC) 5 MG tablet Take 1 tablet by mouth daily. 10/05/17  Yes [provider]  aspirin 81 MG tablet Take 81 mg by mouth daily.   Yes [provider]  busPIRone (BUSPAR) 10 MG tablet Take 10 mg by mouth 2 (two) times daily. 02/08/19  Yes [provider]  cyanocobalamin 1000 MCG tablet Take by mouth.   Yes [provider]  LORazepam (ATIVAN) 1 MG tablet Take 1 tablet (1 mg total) by mouth every 8 (eight) hours as needed for anxiety. 04/21/15  Yes Payton Mccallum, MD    metFORMIN (GLUCOPHAGE) 1000 MG tablet Take 1,000 mg by mouth 2 (two) times daily with a meal.   Yes [provider]  metoprolol succinate (TOPROL-XL) 25 MG 24 hr tablet Take 2 tablets (50 mg total) by mouth daily. 11/22/18  Yes Don Perking, [place], MD  naproxen (NAPROSYN) 500 MG tablet Take 500 mg by mouth 2 (two) times daily with a meal.   Yes [provider]  norethindrone-ethinyl estradiol (LOESTRIN) 1-20 MG-MCG tablet Take by mouth. 11/04/18 11/04/19 Yes [provider]  rosuvastatin (CRESTOR) 5 MG tablet Take 5 mg by mouth daily at 6 PM.   Yes [provider]  Semaglutide,0.25 or 0.5MG /DOS, 2 MG/1.5ML SOPN Inject 0.25 mg into the skin once a week. 10/02/17  Yes [provider]  TRESIBA FLEXTOUCH 100 UNIT/ML SOPN FlexTouch Pen Inject 20 Units into the skin daily. 10/02/17  Yes [provider]  doxycycline (VIBRAMYCIN) 100 MG capsule Take 1 capsule (100 mg total) by mouth 2 (two) times daily. 02/25/19   Lutricia Feil, PA-C  mupirocin ointment (BACTROBAN) 2 % Apply 1 application topically 3 (three) times daily. 02/25/19   Lutricia Feil, PA-C  nitroGLYCERIN (NITROSTAT) 0.4 MG SL tablet Place 0.4 mg under the tongue every 5 (five) minutes  as needed for chest pain.    [provider]  valACYclovir (VALTREX) 1000 MG tablet Take 1,000 mg by mouth daily.    [provider]  hydrochlorothiazide (HYDRODIURIL) 12.5 MG tablet Take 1 tablet (12.5 mg total) by mouth daily. 11/22/18 11/22/18  Rudene Re, MD    Family History Family History  Problem Relation Age of Onset  . Stroke Mother   . Diabetes Mother   . Heart disease Mother   . Cancer Father     Social History Social History   Tobacco Use  . Smoking status: Current Every Day Smoker    Types: Cigarettes  . Smokeless tobacco: Never Used  Substance Use Topics  . Alcohol use: Yes  . Drug use: No     Allergies   Lisinopril and Lipitor  [atorvastatin]   Review of Systems Review of Systems  Constitutional: Positive for activity change. Negative for appetite change, chills, diaphoresis, fatigue and fever.  Skin: Positive for rash.  All other systems reviewed and are negative.    Physical Exam Triage Vital Signs ED Triage Vitals  Enc Vitals Group     BP 02/25/19 1102 118/90     Pulse Rate 02/25/19 1102 89     Resp 02/25/19 1102 16     Temp 02/25/19 1102 98.5 F (36.9 C)     Temp Source 02/25/19 1102 Oral     SpO2 02/25/19 1102 97 %     Weight 02/25/19 1057 198 lb (89.8 kg)     Height 02/25/19 1057 5\' 5"  (1.651 m)     Head Circumference --      Peak Flow --      Pain Score 02/25/19 1056 5     Pain Loc --      Pain Edu? --      Excl. in [place]? --    No data found.  Updated Vital Signs BP 118/90 (BP Location: Left Arm)   Pulse 89   Temp 98.5 F (36.9 C) (Oral)   Resp 16   Ht 5\' 5"  (1.651 m)   Wt 198 lb (89.8 kg)   LMP 02/04/2019 (Approximate)   SpO2 97%   BMI 32.95 kg/m   Visual Acuity Right Eye Distance:   Left Eye Distance:   Bilateral Distance:    Right Eye Near:   Left Eye Near:    Bilateral Near:     Physical Exam Vitals and nursing note reviewed. Exam conducted with a chaperone present.  Constitutional:      General: She is not in acute distress.    Appearance: Normal appearance. She is obese. She is not ill-appearing or toxic-appearing.  HENT:     Head: Normocephalic and atraumatic.  Eyes:     Conjunctiva/sclera: Conjunctivae normal.  Musculoskeletal:        General: Normal range of motion.     Cervical back: Normal range of motion and neck supple.  Skin:    General: Skin is warm and dry.     Findings: Lesion present.     Comments: Refer to procedural note for details  Neurological:     General: No focal deficit present.     Mental Status: She is alert and oriented to person, place, and time.  Psychiatric:        Mood and Affect: Mood normal.        Thought Content: Thought  content normal.        Judgment: Judgment normal.      UC Treatments /  Results  Labs (all labs ordered are listed, but only abnormal results are displayed) Labs Reviewed  AEROBIC CULTURE (SUPERFICIAL SPECIMEN)    EKG   Radiology No results found.  Procedures Incision and Drainage  Date/Time: 02/25/2019 12:09 PM Performed by: Lutricia Feil, PA-C Authorized by: Lutricia Feil, PA-C   Consent:    Consent obtained:  Verbal   Consent given by:  Patient   Risks discussed:  Bleeding, incomplete drainage and pain   Alternatives discussed:  No treatment Universal protocol:    Patient identity confirmed:  Verbally with patient Location:    Type:  Abscess   Size:  1.5 x 0.4   Location: Groin at the inguinal fold on the left with pointing abscess. Pre-procedure details:    Skin preparation:  Betadine Anesthesia (see MAR for exact dosages):    Anesthesia method:  Local infiltration   Local anesthetic:  Lidocaine 1% WITH epi Procedure type:    Complexity:  Simple Procedure details:    Needle aspiration: no     Incision types:  Single straight   Incision depth:  Subcutaneous   Scalpel blade:  11   Wound management:  Probed and deloculated and extensive cleaning   Drainage:  Purulent and bloody   Drainage amount:  Moderate   Wound treatment:  Drain placed   Packing materials:  1/4 in gauze   Amount 1/4":  3 inches Post-procedure details:    Patient tolerance of procedure:  Tolerated well, no immediate complications Comments:     Keep area dry. Reinforce dressings if they become soiled. Expect to have drainage. Return to our clinic in 2 days for packing removal. Return to clinic sooner if necessary. Wear loose fitting clothing so as not to rub the area.    (including critical care time)  Medications Ordered in UC Medications - No data to display  Initial Impression / Assessment and Plan / UC Course  I have reviewed the triage vital signs and the nursing  notes.  Pertinent labs & imaging results that were available during my care of the patient were reviewed by me and considered in my medical decision making (see chart for details).   50 year old female presents with an abscess of the left groin that she has had on and off for over 9 months.  It occasionally drains and has recently been draining purulent material.  Treating at home with tea tree oil.  Examination today showed a 4 mm x 1/2 cm abscess in the inguinal fold on the left.  After informed consent the patient consented to an I&D.  This was performed without incident.   please refer to the procedural note.  She tolerated the procedure well.  She was told to keep the area dry.  Wear loose clothing so as not to rub against the area.  She will expect additional drainage to occur and will reinforce the dressing as necessary.  She will return in 2 days for removal of the packing.  Intraprocedural cultures were obtained and submitted for C&S.  She was advised to return to the clinic if any untoward events occur.  Placed on doxycycline 100 mg twice daily.  She was also given a prescription for mupirocin ointment that she will apply after all packing has been accomplished.   Final Clinical Impressions(s) / UC Diagnoses   Final diagnoses:  Abscess     Discharge Instructions     Keep area dry. Reinforce dressings if they become soiled. Expect to have drainage. Return  to our clinic in 2 days for packing removal. Return to clinic sooner if necessary. Wear loose fitting clothing so as not to rub the area.    ED Prescriptions    Medication Sig Dispense Auth. Provider   doxycycline (VIBRAMYCIN) 100 MG capsule Take 1 capsule (100 mg total) by mouth 2 (two) times daily. 14 capsule Ovid Curd P, PA-C   mupirocin ointment (BACTROBAN) 2 % Apply 1 application topically 3 (three) times daily. 22 g Lutricia Feil, PA-C     PDMP not reviewed this encounter.   Lutricia Feil, PA-C 02/25/19  1739

## 2019-02-25 NOTE — ED Triage Notes (Signed)
Patient c/o abscess off and on on the left side of her groin for 9 months. Patient reports some draining 3 months ago.  Patient states that the pain has gotten worse over the past week.  Patient denies fevers.

## 2019-02-25 NOTE — Discharge Instructions (Signed)
Keep area dry. Reinforce dressings if they become soiled. Expect to have drainage. Return to our clinic in 2 days for packing removal. Return to clinic sooner if necessary. Wear loose fitting clothing so as not to rub the area.

## 2019-02-27 ENCOUNTER — Ambulatory Visit
Admission: EM | Admit: 2019-02-27 | Discharge: 2019-02-27 | Disposition: A | Discharge status: Home / Self Care | Payer: BC Managed Care – PPO | Attending: Family Medicine | Admitting: Family Medicine

## 2019-02-27 ENCOUNTER — Other Ambulatory Visit: Payer: Self-pay

## 2019-02-27 DIAGNOSIS — Z09 Encounter for follow-up examination after completed treatment for conditions other than malignant neoplasm: Secondary | ICD-10-CM

## 2019-02-27 LAB — AEROBIC CULTURE W GRAM STAIN (SUPERFICIAL SPECIMEN)

## 2019-02-27 NOTE — Discharge Instructions (Signed)
Keep clean.  Finish antibiotic course.  Take care  Dr. Adriana Simas

## 2019-02-27 NOTE — ED Provider Notes (Signed)
[facility]    CSN: [id number] Arrival date & time: 02/27/19  0904      History   Chief Complaint Chief Complaint  Patient presents with  . Abscess   HPI  49 year old female presents for recheck/follow up regarding recent abscess.  Patient was seen on 2/12.  Had incision and drainage of inguinal abscess.  Packing placed.  She was placed on doxycycline.  She presents today for follow-up.  She endorses compliance with antibiotic regimen.  She is here for packing removal and reassessment.  Minimal pain.  Improved.  No other complaints at this time.  Past Medical History:  Diagnosis Date  . Coronary artery disease   . Diabetes mellitus without complication ([place])   . Hypertension   . Insomnia   . Renal disorder    Past Surgical History:  Procedure Laterality Date  . CARDIAC SURGERY     OB History   No obstetric history on file.      Home Medications    Prior to Admission medications   Medication Sig Start Date End Date Taking? Authorizing Provider  amLODipine (NORVASC) 5 MG tablet Take 1 tablet by mouth daily. 10/05/17  Yes [provider]  aspirin 81 MG tablet Take 81 mg by mouth daily.   Yes [provider]  busPIRone (BUSPAR) 10 MG tablet Take 10 mg by mouth 2 (two) times daily. 02/08/19  Yes [provider]  cyanocobalamin 1000 MCG tablet Take by mouth.   Yes [provider]  doxycycline (VIBRAMYCIN) 100 MG capsule Take 1 capsule (100 mg total) by mouth 2 (two) times daily. 02/25/19  Yes Crecencio Mc P, PA-C  LORazepam (ATIVAN) 1 MG tablet Take 1 tablet (1 mg total) by mouth every 8 (eight) hours as needed for anxiety. 04/21/15  Yes Norval Gable, MD  metFORMIN (GLUCOPHAGE) 1000 MG tablet Take 1,000 mg by mouth 2 (two) times daily with a meal.   Yes [provider]  metoprolol succinate (TOPROL-XL) 25 MG 24 hr tablet Take 2 tablets (50 mg total) by mouth daily. 11/22/18  Yes [place], [place], MD  mupirocin  ointment (BACTROBAN) 2 % Apply 1 application topically 3 (three) times daily. 02/25/19  Yes Lorin Picket, PA-C  naproxen (NAPROSYN) 500 MG tablet Take 500 mg by mouth 2 (two) times daily with a meal.   Yes [provider]  nitroGLYCERIN (NITROSTAT) 0.4 MG SL tablet Place 0.4 mg under the tongue every 5 (five) minutes as needed for chest pain.   Yes [provider]  norethindrone-ethinyl estradiol (LOESTRIN) 1-20 MG-MCG tablet Take by mouth. 11/04/18 11/04/19 Yes [provider]  rosuvastatin (CRESTOR) 5 MG tablet Take 5 mg by mouth daily at 6 PM.   Yes [provider]  Semaglutide,0.25 or 0.5MG /DOS, 2 MG/1.5ML SOPN Inject 0.25 mg into the skin once a week. 10/02/17  Yes [provider]  TRESIBA FLEXTOUCH 100 UNIT/ML SOPN FlexTouch Pen Inject 20 Units into the skin daily. 10/02/17  Yes [provider]  valACYclovir (VALTREX) 1000 MG tablet Take 1,000 mg by mouth daily.   Yes [provider]  hydrochlorothiazide (HYDRODIURIL) 12.5 MG tablet Take 1 tablet (12.5 mg total) by mouth daily. 11/22/18 11/22/18  Rudene Re, MD    Family History Family History  Problem Relation Age of Onset  . Stroke Mother   . Diabetes Mother   . Heart disease Mother   . Cancer Father     Social History Social History   Tobacco Use  .  Smoking status: Current Every Day Smoker    Types: Cigarettes  . Smokeless tobacco: Never Used  Substance Use Topics  . Alcohol use: Yes  . Drug use: No     Allergies   Lisinopril and Lipitor [atorvastatin]   Review of Systems Review of Systems  Constitutional: Negative.   Skin: Positive for wound.   Physical Exam Triage Vital Signs ED Triage Vitals  Enc Vitals Group     BP 02/27/19 0915 (!) 129/96     Pulse Rate 02/27/19 0915 86     Resp --      Temp 02/27/19 0915 98.9 F (37.2 C)     Temp Source 02/27/19 0915 Oral     SpO2 02/27/19 0915 100 %     Weight 02/27/19 0914 198 lb (89.8 kg)      Height 02/27/19 0914 5\' 5"  (1.651 m)     Head Circumference --      Peak Flow --      Pain Score 02/27/19 0913 3     Pain Loc --      Pain Edu? --      Excl. in GC? --    Updated Vital Signs BP (!) 129/96 (BP Location: Left Arm)   Pulse 86   Temp 98.9 F (37.2 C) (Oral)   Ht 5\' 5"  (1.651 m)   Wt 89.8 kg   LMP 02/04/2019 (Approximate)   SpO2 100%   BMI 32.95 kg/m   Visual Acuity Right Eye Distance:   Left Eye Distance:   Bilateral Distance:    Right Eye Near:   Left Eye Near:    Bilateral Near:     Physical Exam Vitals and nursing note reviewed.  Constitutional:      General: She is not in acute distress.    Appearance: Normal appearance. She is not ill-appearing.  HENT:     Head: Normocephalic and atraumatic.  Eyes:     General:        Right eye: No discharge.        Left eye: No discharge.     Conjunctiva/sclera: Conjunctivae normal.  Pulmonary:     Effort: Pulmonary effort is normal. No respiratory distress.  Skin:    Comments: Right inguinal region with a small wound with packing.  No purulent drainage.  Minimal tenderness.  Neurological:     Mental Status: She is alert.  Psychiatric:        Mood and Affect: Mood normal.        Behavior: Behavior normal.    UC Treatments / Results  Labs (all labs ordered are listed, but only abnormal results are displayed) Labs Reviewed - No data to display  EKG   Radiology No results found.  Procedures Procedures (including critical care time)  Medications Ordered in UC Medications - No data to display  Initial Impression / Assessment and Plan / UC Course  I have reviewed the triage vital signs and the nursing notes.  Pertinent labs & imaging results that were available during my care of the patient were reviewed by me and considered in my medical decision making (see chart for details).    49 year old female presents for reevaluation regarding her abscess after incision and drainage.  Packing removed.   She is doing well at this time.  Finish antibiotic course.  Supportive care.  Final Clinical Impressions(s) / UC Diagnoses   Final diagnoses:  Encounter for recheck of abscess following incision and drainage     Discharge Instructions  Keep clean.  Finish antibiotic course.  Take care  Dr. Adriana Simas    ED Prescriptions    None     PDMP not reviewed this encounter.   Tommie Sams, [place] 02/27/19 [phone]

## 2019-02-27 NOTE — ED Triage Notes (Signed)
Pt presents s/p excision of abscess 2 days ago. The area was incised, drained and packed. Pt has returned for follow up and packing removal.

## 2020-03-11 IMAGING — MR MR HEAD W/O CM
11 series · 41 of 48 positions shown · non-contrast
Comparison: CT HEAD October 06, 2017

CLINICAL DATA: LEFT extremity numbness beginning at 11 a.m..
History of hypertension and diabetes.

EXAM:
MRI HEAD WITHOUT CONTRAST
TECHNIQUE: Multiplanar, multiecho pulse sequences of the brain and surrounding
structures were obtained without intravenous contrast.

[Series 5: ax dwi_tracew · axial · 3.0mm · 0.73mm/px · z∈[-64,+96]mm · 5 of 55 slices shown]
[im 1/55]
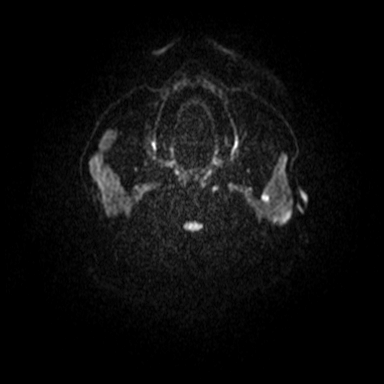
[im 14/55]
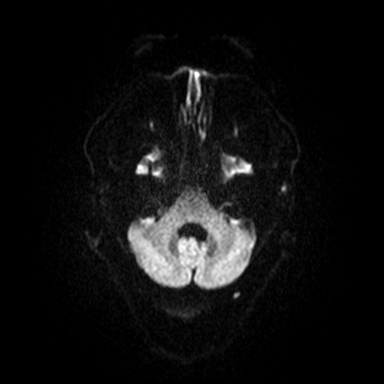
[im 28/55]
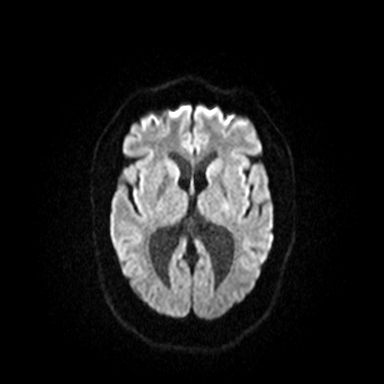
[im 41/55]
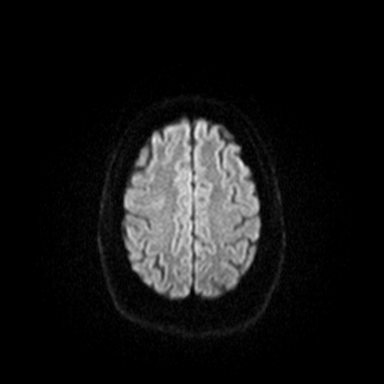
[im 55/55]
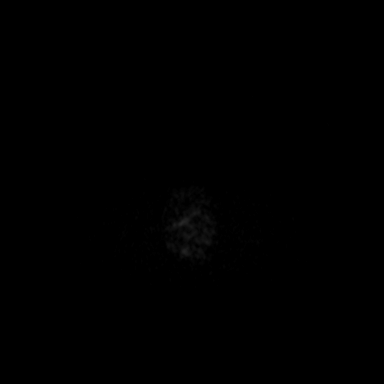

[Series 6: ax dwi_adc · axial · 3.0mm · 0.73mm/px · z∈[-64,+96]mm · 5 of 55 slices shown]
[im 1/55]
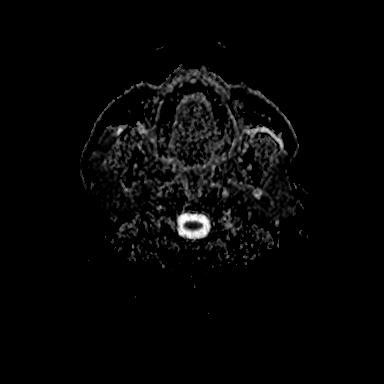
[im 14/55]
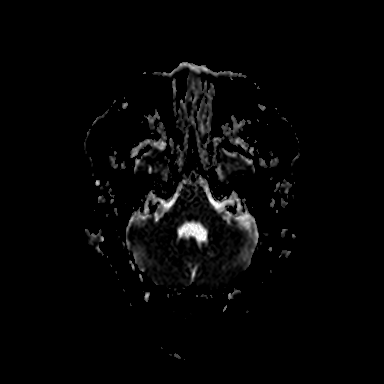
[im 28/55]
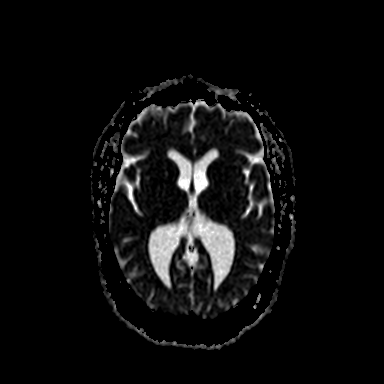
[im 41/55]
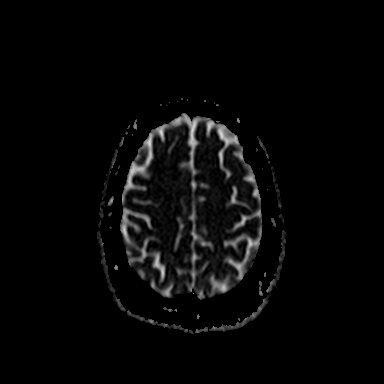
[im 55/55]
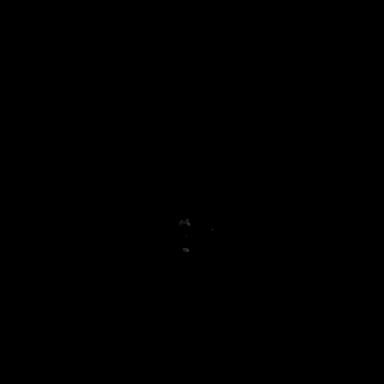

[Series 7: cor dwi_tracew · coronal · 5.0mm · 0.60mm/px · 3 of 41 slices shown]
[im 1/41]
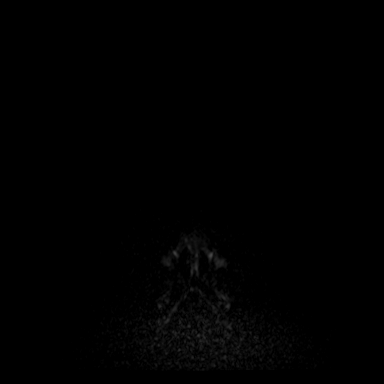
[im 21/41]
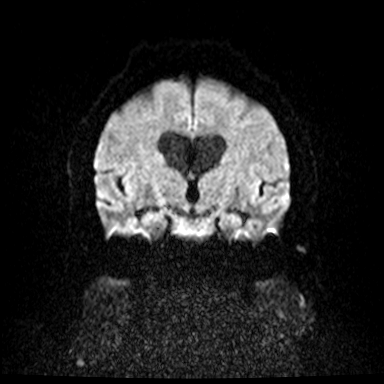
[im 41/41]
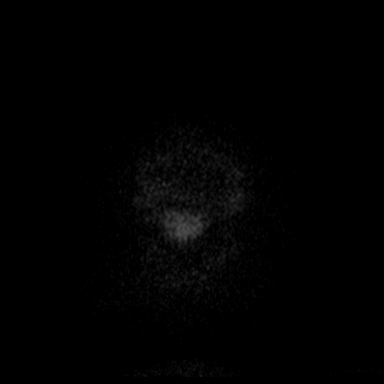

[Series 8: cor dwi_adc · coronal · 5.0mm · 0.60mm/px · 3 of 40 slices shown]
[im 1/40]
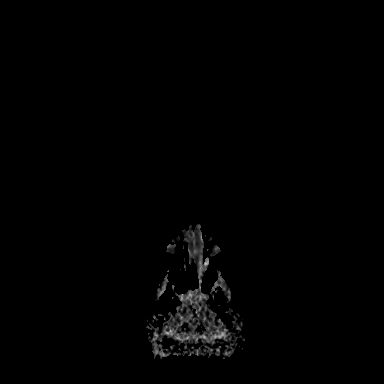
[im 20/40]
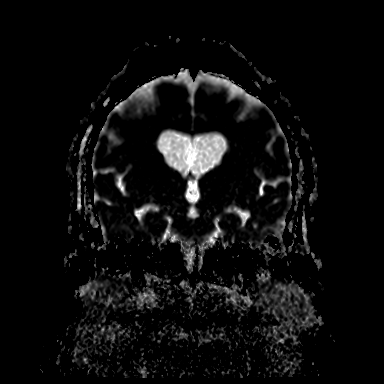
[im 40/40]
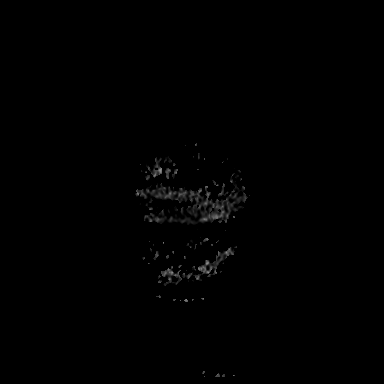

[Series 9: T1 · sagittal · 5.0mm · 0.62mm/px · 2 of 23 slices shown (1 of 2)]
[im 1/23]
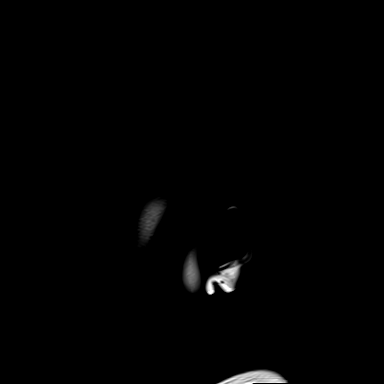
[im 23/23]
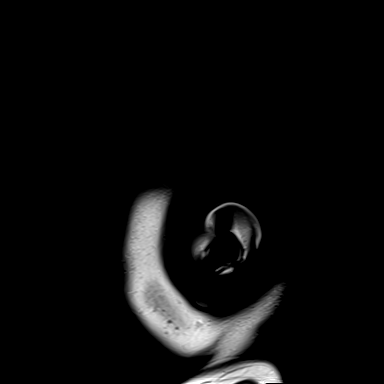

[Series 10: T2 · axial · 5.0mm · 0.53mm/px · z∈[-57,+97]mm · 2 of 27 slices shown (1 of 2)]
[im 1/27]
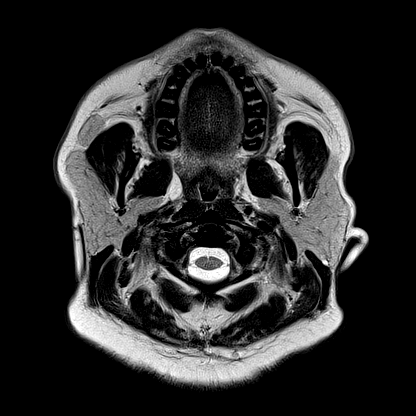
[im 27/27]
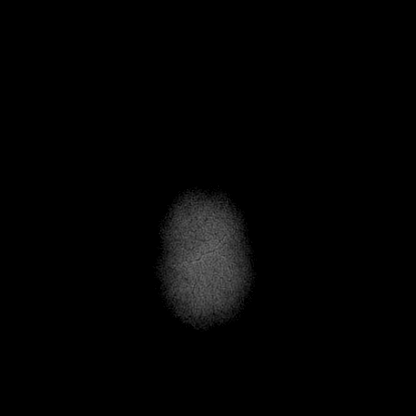

[Series 11: swi_images · axial · 3.0mm · 0.90mm/px · z∈[-69,+107]mm · 5 of 60 slices shown]
[im 1/60]
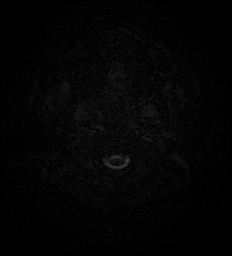
[im 15/60]
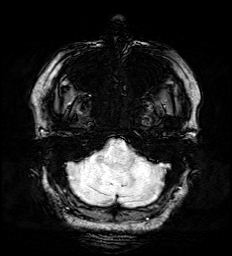
[im 30/60]
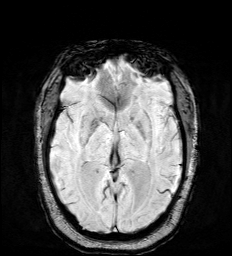
[im 45/60]
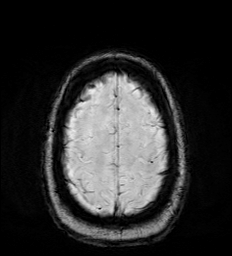
[im 60/60]
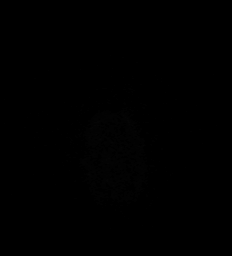

[Series 12: mip_images(sw) · axial · 24.0mm · 0.90mm/px · z∈[-58,-8]mm · 2 of 53 slices shown]
[im 1/53]
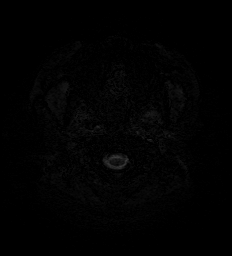
[im 18/53]
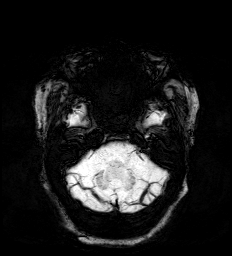

[Series 13: FLAIR · axial · 3.0mm · 0.53mm/px · z∈[-60,+100]mm · 4 of 55 slices shown]
[im 1/55]
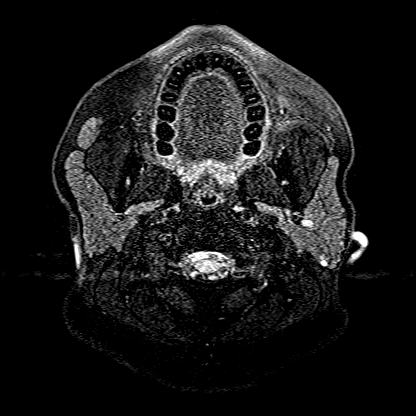
[im 19/55]
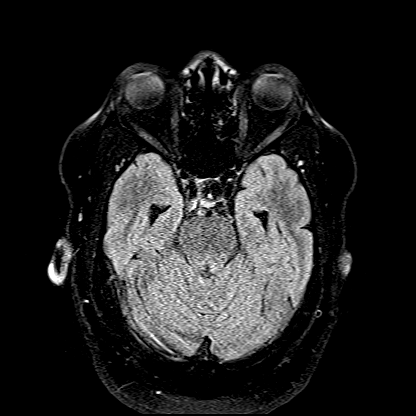
[im 37/55]
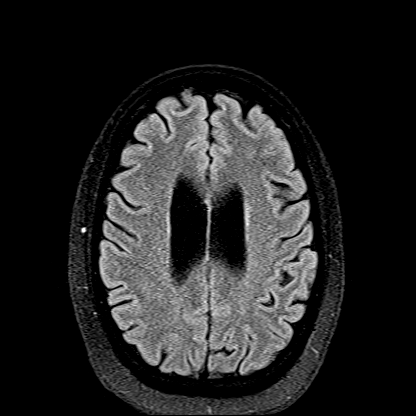
[im 55/55]
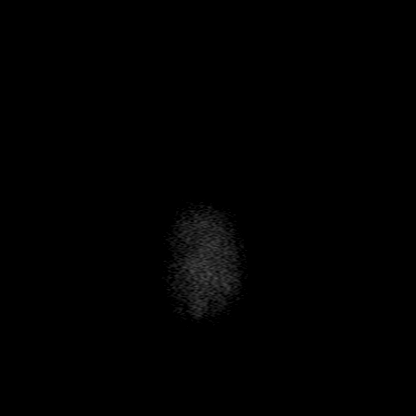

[Series 14: T1 · axial · 1.0mm · 0.98mm/px · z∈[-68,+106]mm · 8 of 172 slices shown (2 of 2)]
[im 1/172]
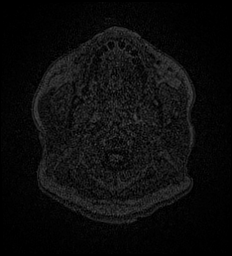
[im 29/172]
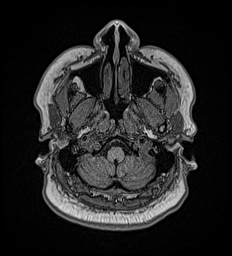
[im 58/172]
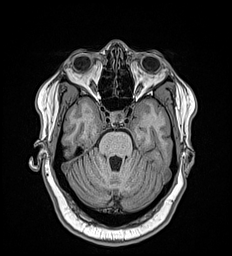
[im 72/172]
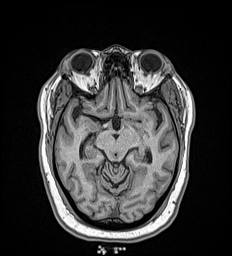
[im 100/172]
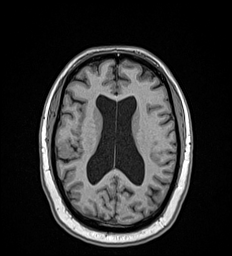
[im 115/172]
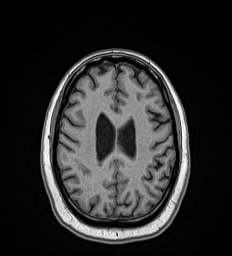
[im 143/172]
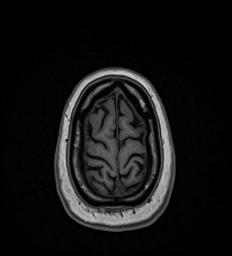
[im 172/172]
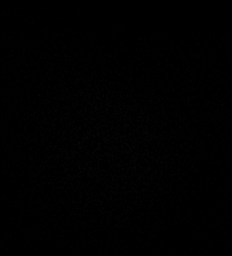

[Series 15: T2 · coronal · 5.0mm · 0.57mm/px · 2 of 31 slices shown (2 of 2)]
[im 1/31]
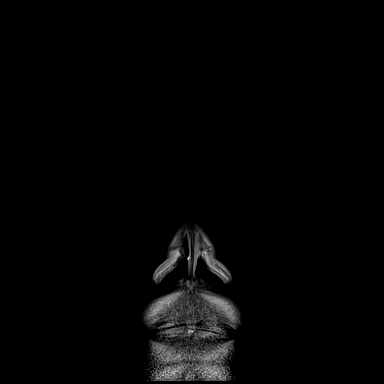
[im 31/31]
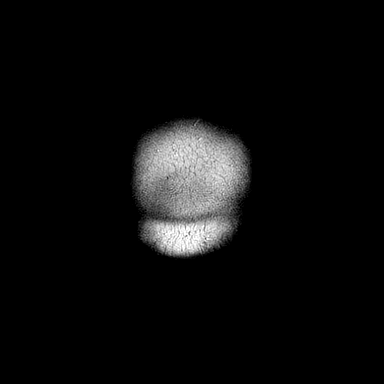

[41 of 48 positions shown; findings below may reference images not displayed]

FINDINGS: INTRACRANIAL CONTENTS: No reduced diffusion to suggest acute
ischemia or hyperacute demyelination. No susceptibility artifact to
suggest hemorrhage. Moderate global parenchymal brain volume loss.
Minimal periventricular nonspecific white matter FLAIR T2
hyperintensities. No hydrocephalus. No suspicious parenchymal
signal, masses, mass effect. No abnormal extra-axial fluid
collections. No extra-axial masses.

VASCULAR: Normal major intracranial vascular flow voids present at
skull base.

SKULL AND UPPER CERVICAL SPINE: No abnormal sellar expansion. No
suspicious calvarial bone marrow signal. Craniocervical junction
maintained.

SINUSES/ORBITS: The mastoid air-cells and included paranasal sinuses
are well-aerated.The included ocular globes and orbital contents are
non-suspicious.

OTHER: None.
IMPRESSION: 1. No acute intracranial process.
2. Moderate global parenchymal brain volume loss.
